# Patient Record
Sex: Male | Born: 1975 | Hispanic: Yes | Marital: Married | Smoking: Never smoker
Health system: Southern US, Community
[De-identification: ages and names within clinical notes are randomized; demographics above are authoritative.]

---

## 2017-06-25 ENCOUNTER — Encounter (HOSPITAL_COMMUNITY): Payer: Self-pay

## 2017-06-25 ENCOUNTER — Other Ambulatory Visit: Payer: Self-pay

## 2017-06-25 ENCOUNTER — Emergency Department (HOSPITAL_COMMUNITY): Payer: No Typology Code available for payment source

## 2017-06-25 ENCOUNTER — Emergency Department (HOSPITAL_COMMUNITY)
Admission: EM | Admit: 2017-06-25 | Discharge: 2017-06-25 | Disposition: A | Discharge status: Home / Self Care | Payer: No Typology Code available for payment source | Attending: Emergency Medicine | Admitting: Emergency Medicine

## 2017-06-25 DIAGNOSIS — S161XXA Strain of muscle, fascia and tendon at neck level, initial encounter: Secondary | ICD-10-CM | POA: Insufficient documentation

## 2017-06-25 DIAGNOSIS — S4992XA Unspecified injury of left shoulder and upper arm, initial encounter: Secondary | ICD-10-CM | POA: Diagnosis not present

## 2017-06-25 DIAGNOSIS — Y9389 Activity, other specified: Secondary | ICD-10-CM | POA: Diagnosis not present

## 2017-06-25 DIAGNOSIS — S8392XA Sprain of unspecified site of left knee, initial encounter: Secondary | ICD-10-CM | POA: Insufficient documentation

## 2017-06-25 DIAGNOSIS — S39012A Strain of muscle, fascia and tendon of lower back, initial encounter: Secondary | ICD-10-CM | POA: Insufficient documentation

## 2017-06-25 DIAGNOSIS — Y92410 Unspecified street and highway as the place of occurrence of the external cause: Secondary | ICD-10-CM | POA: Diagnosis not present

## 2017-06-25 DIAGNOSIS — Y999 Unspecified external cause status: Secondary | ICD-10-CM | POA: Insufficient documentation

## 2017-06-25 DIAGNOSIS — R1032 Left lower quadrant pain: Secondary | ICD-10-CM | POA: Insufficient documentation

## 2017-06-25 DIAGNOSIS — R531 Weakness: Secondary | ICD-10-CM | POA: Diagnosis not present

## 2017-06-25 LAB — COMPREHENSIVE METABOLIC PANEL
ALBUMIN: 4.4 g/dL (ref 3.5–5.0)
ALT: 21 U/L (ref 17–63)
ANION GAP: 8 (ref 5–15)
AST: 21 U/L (ref 15–41)
Alkaline Phosphatase: 48 U/L (ref 38–126)
BUN: 10 mg/dL (ref 6–20)
CHLORIDE: 103 mmol/L (ref 101–111)
CO2: 28 mmol/L (ref 22–32)
Calcium: 9.6 mg/dL (ref 8.9–10.3)
Creatinine, Ser: 0.91 mg/dL (ref 0.61–1.24)
GFR calc Af Amer: >60 mL/min (ref >60)
GFR calc non Af Amer: >60 mL/min (ref >60)
GLUCOSE: 104 mg/dL — AB (ref 65–99)
POTASSIUM: 4.6 mmol/L (ref 3.5–5.1)
SODIUM: 139 mmol/L (ref 135–145)
TOTAL PROTEIN: 7.4 g/dL (ref 6.5–8.1)
Total Bilirubin: 0.9 mg/dL (ref 0.3–1.2)

## 2017-06-25 LAB — CBC WITH DIFFERENTIAL/PLATELET
BASOS ABS: 0 10*3/uL (ref 0.0–0.1)
Basophils Relative: 0 %
Eosinophils Absolute: 0.5 10*3/uL (ref 0.0–0.7)
Eosinophils Relative: 5 %
HEMATOCRIT: 48.1 % (ref 39.0–52.0)
Hemoglobin: 16.4 g/dL (ref 13.0–17.0)
LYMPHS ABS: 1.9 10*3/uL (ref 0.7–4.0)
LYMPHS PCT: 19 %
MCH: 32.7 pg (ref 26.0–34.0)
MCHC: 34.1 g/dL (ref 30.0–36.0)
MCV: 95.8 fL (ref 78.0–100.0)
MONOS PCT: 5 %
Monocytes Absolute: 0.5 10*3/uL (ref 0.1–1.0)
NEUTROS ABS: 7.4 10*3/uL (ref 1.7–7.7)
Neutrophils Relative %: 71 %
Platelets: 270 10*3/uL (ref 150–400)
RBC: 5.02 MIL/uL (ref 4.22–5.81)
RDW: 12.1 % (ref 11.5–15.5)
WBC: 10.4 10*3/uL (ref 4.0–10.5)

## 2017-06-25 MED ORDER — IBUPROFEN 200 MG PO TABS
600.0000 mg | ORAL_TABLET | Freq: Once | ORAL | Status: AC
Start: 2017-06-25 — End: 2017-06-25
  Administered 2017-06-25: 600 mg via ORAL
  Filled 2017-06-25: qty 3

## 2017-06-25 MED ORDER — IOPAMIDOL (ISOVUE-300) INJECTION 61%
INTRAVENOUS | Status: AC
  Filled 2017-06-25: qty 100

## 2017-06-25 MED ORDER — IBUPROFEN 600 MG PO TABS: 600.0000 mg | ORAL_TABLET | Freq: Three times a day (TID) | ORAL | 0 refills | Status: DC | PRN

## 2017-06-25 MED ORDER — IOPAMIDOL (ISOVUE-300) INJECTION 61%
100.0000 mL | Freq: Once | INTRAVENOUS | Status: AC | PRN
  Administered 2017-06-25: 100 mL via INTRAVENOUS

## 2017-06-25 MED ORDER — LORAZEPAM 2 MG/ML IJ SOLN
1.0000 mg | Freq: Once | INTRAMUSCULAR | Status: AC | PRN
  Administered 2017-06-25: 1 mg via INTRAVENOUS
  Filled 2017-06-25: qty 1

## 2017-06-25 MED ORDER — FENTANYL CITRATE (PF) 100 MCG/2ML IJ SOLN
50.0000 ug | Freq: Once | INTRAMUSCULAR | Status: AC
  Administered 2017-06-25: 50 ug via INTRAVENOUS
  Filled 2017-06-25: qty 2

## 2017-06-25 NOTE — Discharge Instructions (Addendum)
If you develop worsening pain, weakness or numbness in your arms or legs, severe headache, abdominal pain, or any other new/concerning symptoms then return to the ER for evaluation.  Use ibuprofen and/or Tylenol for pain.  You may use ice and/or heat as well.   Si presenta un empeoramiento del dolor, debilidad o entumecimiento en los brazos o las piernas, dolor de cabeza intenso, dolor abdominal o cualquier otro sntoma nuevo o relacionado, vuelva a la sala de emergencias para su evaluacin. Use ibuprofeno y / o Tylenol para [employer] Officerel dolor. Puedes usar hielo y / o Airline pilotcalor tambin.
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
## 2017-06-27 ENCOUNTER — Emergency Department (HOSPITAL_COMMUNITY): Payer: No Typology Code available for payment source

## 2017-06-27 ENCOUNTER — Encounter (HOSPITAL_COMMUNITY): Payer: Self-pay | Admitting: Obstetrics and Gynecology

## 2017-06-27 ENCOUNTER — Emergency Department (HOSPITAL_COMMUNITY)
Admission: EM | Admit: 2017-06-27 | Discharge: 2017-06-27 | Disposition: A | Discharge status: Home / Self Care | Payer: No Typology Code available for payment source | Attending: Emergency Medicine | Admitting: Emergency Medicine

## 2017-06-27 ENCOUNTER — Other Ambulatory Visit: Payer: Self-pay

## 2017-06-27 DIAGNOSIS — M545 Low back pain: Secondary | ICD-10-CM | POA: Diagnosis present

## 2017-06-27 DIAGNOSIS — S39012D Strain of muscle, fascia and tendon of lower back, subsequent encounter: Secondary | ICD-10-CM | POA: Insufficient documentation

## 2017-06-27 MED ORDER — IBUPROFEN 800 MG PO TABS
800.0000 mg | ORAL_TABLET | Freq: Once | ORAL | Status: AC
  Administered 2017-06-27: 800 mg via ORAL
  Filled 2017-06-27: qty 1

## 2017-06-27 MED ORDER — TETANUS-DIPHTH-ACELL PERTUSSIS 5-2.5-18.5 LF-MCG/0.5 IM SUSP: 0.5000 mL | Freq: Once | INTRAMUSCULAR | Status: DC

## 2017-06-27 MED ORDER — CYCLOBENZAPRINE HCL 10 MG PO TABS
10.0000 mg | ORAL_TABLET | Freq: Once | ORAL | Status: AC
  Administered 2017-06-27: 10 mg via ORAL
  Filled 2017-06-27: qty 1

## 2017-06-27 MED ORDER — DICLOFENAC SODIUM 50 MG PO TBEC: 50.0000 mg | DELAYED_RELEASE_TABLET | Freq: Two times a day (BID) | ORAL | 0 refills | Status: AC

## 2017-06-27 MED ORDER — CYCLOBENZAPRINE HCL 10 MG PO TABS: 5.0000 mg | ORAL_TABLET | Freq: Once | ORAL | Status: DC

## 2017-06-27 MED ORDER — CYCLOBENZAPRINE HCL 10 MG PO TABS: 10.0000 mg | ORAL_TABLET | Freq: Two times a day (BID) | ORAL | 0 refills | Status: AC | PRN
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
